# Patient Record
Sex: Male | Born: 1980
Health system: Southern US, Community
[De-identification: ages and names within clinical notes are randomized; demographics above are authoritative.]

## PROBLEM LIST (undated history)

## (undated) DIAGNOSIS — K219 Gastro-esophageal reflux disease without esophagitis: Secondary | ICD-10-CM

## (undated) DIAGNOSIS — L709 Acne, unspecified: Secondary | ICD-10-CM

## (undated) DIAGNOSIS — J309 Allergic rhinitis, unspecified: Secondary | ICD-10-CM

## (undated) DIAGNOSIS — K589 Irritable bowel syndrome without diarrhea: Secondary | ICD-10-CM

## (undated) DIAGNOSIS — K8020 Calculus of gallbladder without cholecystitis without obstruction: Secondary | ICD-10-CM

## (undated) HISTORY — DX: Gastro-esophageal reflux disease without esophagitis: K21.9

## (undated) HISTORY — DX: Irritable bowel syndrome without diarrhea: K58.9

## (undated) HISTORY — DX: Allergic rhinitis, unspecified: J30.9

## (undated) HISTORY — DX: Calculus of gallbladder without cholecystitis without obstruction: K80.20

## (undated) HISTORY — DX: Acne, unspecified: L70.9

---
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
## 2000-01-31 ENCOUNTER — Emergency Department (HOSPITAL_COMMUNITY): Admission: EM | Admit: 2000-01-31 | Discharge: 2000-02-01 | Payer: Self-pay | Admitting: Emergency Medicine

## 2000-01-31 ENCOUNTER — Emergency Department (HOSPITAL_COMMUNITY): Admission: EM | Admit: 2000-01-31 | Discharge: 2000-01-31 | Payer: Self-pay | Admitting: Emergency Medicine

## 2003-02-23 ENCOUNTER — Emergency Department (HOSPITAL_COMMUNITY): Admission: EM | Admit: 2003-02-23 | Discharge: 2003-02-23 | Payer: Self-pay

## 2007-08-13 ENCOUNTER — Encounter: Admission: RE | Admit: 2007-08-13 | Discharge: 2007-08-13 | Payer: Self-pay | Admitting: Occupational Medicine

## 2007-11-03 ENCOUNTER — Emergency Department (HOSPITAL_COMMUNITY): Admission: EM | Admit: 2007-11-03 | Discharge: 2007-11-03 | Payer: Self-pay | Admitting: Emergency Medicine

## 2007-11-06 ENCOUNTER — Emergency Department (HOSPITAL_COMMUNITY): Admission: EM | Admit: 2007-11-06 | Discharge: 2007-11-06 | Payer: Self-pay | Admitting: Emergency Medicine

## 2009-05-28 ENCOUNTER — Emergency Department (HOSPITAL_COMMUNITY): Admission: EM | Admit: 2009-05-28 | Discharge: 2009-05-28 | Payer: Self-pay | Admitting: Emergency Medicine

## 2011-11-30 ENCOUNTER — Emergency Department (HOSPITAL_COMMUNITY)
Admission: EM | Admit: 2011-11-30 | Discharge: 2011-11-30 | Disposition: A | Discharge status: Home / Self Care | Payer: 59 | Attending: Emergency Medicine | Admitting: Emergency Medicine

## 2011-11-30 ENCOUNTER — Other Ambulatory Visit: Payer: Self-pay

## 2011-11-30 ENCOUNTER — Encounter (HOSPITAL_COMMUNITY): Payer: Self-pay | Admitting: *Deleted

## 2011-11-30 DIAGNOSIS — R197 Diarrhea, unspecified: Secondary | ICD-10-CM

## 2011-11-30 DIAGNOSIS — R55 Syncope and collapse: Secondary | ICD-10-CM | POA: Insufficient documentation

## 2011-11-30 LAB — POCT I-STAT, CHEM 8
BUN: 21 mg/dL (ref 6–23)
Calcium, Ion: 1.23 mmol/L (ref 1.12–1.32)
Chloride: 106 mEq/L (ref 96–112)
Creatinine, Ser: 1.3 mg/dL (ref 0.50–1.35)
Glucose, Bld: 119 mg/dL — ABNORMAL HIGH (ref 70–99)
HCT: 51 % (ref 39.0–52.0)
Hemoglobin: 17.3 g/dL — ABNORMAL HIGH (ref 13.0–17.0)
Potassium: 3.9 mEq/L (ref 3.5–5.1)
Sodium: 143 mEq/L (ref 135–145)
TCO2: 27 mmol/L (ref 0–100)

## 2011-11-30 LAB — CBC
HCT: 46.7 % (ref 39.0–52.0)
Hemoglobin: 16.2 g/dL (ref 13.0–17.0)
MCH: 30.1 pg (ref 26.0–34.0)
MCHC: 34.7 g/dL (ref 30.0–36.0)
MCV: 86.8 fL (ref 78.0–100.0)
Platelets: 169 10*3/uL (ref 150–400)
RBC: 5.38 MIL/uL (ref 4.22–5.81)
RDW: 13.6 % (ref 11.5–15.5)
WBC: 7.4 10*3/uL (ref 4.0–10.5)

## 2011-11-30 LAB — DIFFERENTIAL
Basophils Relative: 0 % (ref 0–1)
Lymphs Abs: 1.1 10*3/uL (ref 0.7–4.0)
Monocytes Relative: 6 % (ref 3–12)
Neutro Abs: 5.7 10*3/uL (ref 1.7–7.7)
Neutrophils Relative %: 78 % — ABNORMAL HIGH (ref 43–77)

## 2011-11-30 MED ORDER — LOPERAMIDE HCL 2 MG PO CAPS
4.0000 mg | ORAL_CAPSULE | ORAL | Status: DC | PRN
  Administered 2011-11-30: 4 mg via ORAL
  Filled 2011-11-30: qty 2

## 2011-11-30 MED ORDER — SODIUM CHLORIDE 0.9 % IV BOLUS (SEPSIS)
1000.0000 mL | Freq: Once | INTRAVENOUS | Status: AC
  Administered 2011-11-30: 1000 mL via INTRAVENOUS

## 2011-11-30 NOTE — ED Notes (Signed)
Received report from Lindsey, RN.

## 2011-11-30 NOTE — ED Provider Notes (Signed)
Medical screening examination/treatment/procedure(s) were performed by non-physician practitioner and as supervising physician I was immediately available for consultation/collaboration. Rachelanne Whidby Y.   Gavin Pound. Oletta Lamas, MD 11/30/11 [phone]

## 2011-11-30 NOTE — ED Notes (Signed)
The pt has had diarrhea all day today.  abd cramps also

## 2011-11-30 NOTE — ED Notes (Signed)
Midlevel at bedside reevaluating patient

## 2011-11-30 NOTE — ED Notes (Signed)
Pt reports diarrhea x twenty times today. Reports also passing out one hour ago. States unsure if hit head. No noted abrasions to head. Reports "i must be dehydrated". Pulse noted palpable in seventies. Skin turgor good. Reports he is able to hold liquids down. Resp are unlabored. Skin warm and dry. Family at bedside. No acute distress is noted.

## 2011-11-30 NOTE — ED Provider Notes (Signed)
History     CSN: [id number]  Arrival date & time 11/30/11  0203   First MD Initiated Contact with Patient 11/30/11 0234      Chief Complaint  Patient presents with  . Diarrhea    (Consider location/radiation/quality/duration/timing/severity/associated sxs/prior treatment) HPI Comments: 2 Ludeman has had watery diarrhea since 9 AM this morning.  Multiple, episodes he's been trying to sip on fluids.  No vomiting, no nausea.  This evening he had a syncopal episode in the bathroom while attempting to get there in time for bowel movement.  He reports his 31-year-old who attends day care had the same thing one day ago  Patient is a 31 y.o. male presenting with diarrhea. The history is provided by the patient.  Diarrhea The primary symptoms include diarrhea. Primary symptoms do not include fever, abdominal pain, nausea or vomiting. The illness began today. The onset was gradual. The problem has not changed since onset. The illness does not include chills or constipation.    History reviewed. No pertinent past medical history.  History reviewed. No pertinent past surgical history.  History reviewed. No pertinent family history.  History  Substance Use Topics  . Smoking status: Never Smoker   . Smokeless tobacco: Not on file  . Alcohol Use: Yes      Review of Systems  Constitutional: Negative for fever and chills.  Cardiovascular: Negative for chest pain.  Gastrointestinal: Positive for diarrhea. Negative for nausea, vomiting, abdominal pain, constipation and blood in stool.  Neurological: Positive for syncope. Negative for dizziness.    Allergies  Review of patient's allergies indicates no known allergies.  Home Medications   Current Outpatient Rx  Name Route Sig Dispense Refill  . FEXOFENADINE HCL 180 MG PO TABS Oral Take 180 mg by mouth daily.    Marland Kitchen OMEPRAZOLE 20 MG PO CPDR Oral Take 20 mg by mouth daily.      BP 120/50  Pulse 77  Resp 18  SpO2 100%  Physical Exam   Constitutional: He is oriented to person, place, and time. He appears well-developed and well-nourished.  HENT:  Head: Normocephalic.  Eyes: Pupils are equal, round, and reactive to light.  Neck: Normal range of motion. Neck supple.  Cardiovascular: Normal rate.   Pulmonary/Chest: Effort normal.  Abdominal: Soft. There is no tenderness. There is no rebound and no guarding.  Musculoskeletal: Normal range of motion.  Neurological: He is alert and oriented to person, place, and time.  Skin: Skin is warm and dry.    ED Course  Procedures (including critical care time)  Labs Reviewed  DIFFERENTIAL - Abnormal; Notable for the following:    Neutrophils Relative 78 (*)    All other components within normal limits  POCT I-STAT, CHEM 8 - Abnormal; Notable for the following:    Glucose, Bld 119 (*)    Hemoglobin 17.3 (*)    All other components within normal limits  CBC  I-STAT, CHEM 8   No results found.   1. Diarrhea     After 2 L of fluid.  Patient is no longer having abdominal cramping.  Does not feel like he is to have a bowel movement.  No longer dizzy when moving in the bed. Tolerated  by mouth challenge  MDM  Diarrhea with dehydration        Arman Filter, NP 11/30/11 0239  Arman Filter, NP 11/30/11 0500

## 2015-06-09 ENCOUNTER — Emergency Department (HOSPITAL_COMMUNITY)
Admission: EM | Admit: 2015-06-09 | Discharge: 2015-06-09 | Disposition: A | Discharge status: Home / Self Care | Payer: Worker's Compensation | Attending: Emergency Medicine | Admitting: Emergency Medicine

## 2015-06-09 ENCOUNTER — Emergency Department (HOSPITAL_COMMUNITY): Payer: Worker's Compensation

## 2015-06-09 ENCOUNTER — Encounter (HOSPITAL_COMMUNITY): Payer: Self-pay | Admitting: General Practice

## 2015-06-09 DIAGNOSIS — Y939 Activity, unspecified: Secondary | ICD-10-CM | POA: Diagnosis not present

## 2015-06-09 DIAGNOSIS — Z79899 Other long term (current) drug therapy: Secondary | ICD-10-CM | POA: Diagnosis not present

## 2015-06-09 DIAGNOSIS — Y929 Unspecified place or not applicable: Secondary | ICD-10-CM | POA: Insufficient documentation

## 2015-06-09 DIAGNOSIS — S5011XA Contusion of right forearm, initial encounter: Secondary | ICD-10-CM | POA: Insufficient documentation

## 2015-06-09 DIAGNOSIS — S62308A Unspecified fracture of other metacarpal bone, initial encounter for closed fracture: Secondary | ICD-10-CM

## 2015-06-09 DIAGNOSIS — S6991XA Unspecified injury of right wrist, hand and finger(s), initial encounter: Secondary | ICD-10-CM | POA: Diagnosis present

## 2015-06-09 DIAGNOSIS — Y990 Civilian activity done for income or pay: Secondary | ICD-10-CM | POA: Insufficient documentation

## 2015-06-09 DIAGNOSIS — W231XXA Caught, crushed, jammed, or pinched between stationary objects, initial encounter: Secondary | ICD-10-CM | POA: Diagnosis not present

## 2015-06-09 DIAGNOSIS — S62346A Nondisplaced fracture of base of fifth metacarpal bone, right hand, initial encounter for closed fracture: Secondary | ICD-10-CM | POA: Insufficient documentation

## 2015-06-09 MED ORDER — IBUPROFEN 600 MG PO TABS: 600.0000 mg | ORAL_TABLET | Freq: Four times a day (QID) | ORAL | Status: AC | PRN

## 2015-06-09 MED ORDER — HYDROCODONE-ACETAMINOPHEN 5-325 MG PO TABS: 1.0000 | ORAL_TABLET | Freq: Four times a day (QID) | ORAL | Status: DC | PRN
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
## 2015-06-09 NOTE — Discharge Instructions (Signed)
Ibuprofen for pain. Norco for severe pain. Keep your hand elevated. Ice several times a day. Follow-up with a hand specialist next week.   Boxer's Fracture You have a break (fracture) of the fifth metacarpal bone. This is commonly called a boxer's fracture. This is the bone in the hand where the little finger attaches. The fracture is in the end of that bone, closest to the little finger. It is usually caused when you hit an object with a clenched fist. Often, the knuckle is pushed down by the impact. Sometimes, the fracture rotates out of position. A boxer's fracture will usually heal within 6 weeks, if it is treated properly and protected from re-injury. Surgery is sometimes needed. A cast, splint, or bulky hand dressing may be used to protect and immobilize a boxer's fracture. Do not remove this device or dressing until your caregiver approves. Keep your hand elevated, and apply ice packs for 15-20 minutes every 2 hours, for the first 2 days. Elevation and ice help reduce swelling and relieve pain. See your caregiver, or an orthopedic specialist, for follow-up care within the next 10 days. This is to make sure your fracture is healing properly. Document Released: 10/28/2005 Document Revised: 01/20/2012 Document Reviewed: 04/17/2007 [facility] Patient Information 2015 [place], [place]. This information is not intended to replace advice given to you by your health care provider. Make sure you discuss any questions you have with your health care provider.
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
## 2015-06-09 NOTE — Progress Notes (Signed)
Orthopedic Tech Progress Note Patient Details:  Ryan Garrett August 19, 1981 [id number] Applied fiberglass ulnar gutter splint to RUE.  Pulses, sensation, motion intact before and after splinting.  Capillary refill less than 2 seconds before and after splinting. Ortho Devices Type of Ortho Device: Ulna gutter splint Ortho Device/Splint Location: RUE Ortho Device/Splint Interventions: Application   Darrol Poke 06/09/2015, 2:02 PM

## 2016-10-02 ENCOUNTER — Other Ambulatory Visit: Payer: Self-pay | Admitting: Internal Medicine

## 2016-10-02 DIAGNOSIS — R1011 Right upper quadrant pain: Secondary | ICD-10-CM

## 2016-10-15 ENCOUNTER — Ambulatory Visit
Admission: RE | Admit: 2016-10-15 | Discharge: 2016-10-15 | Disposition: A | Discharge status: Home / Self Care | Payer: Commercial Managed Care - HMO | Source: Ambulatory Visit | Attending: Internal Medicine | Admitting: Internal Medicine

## 2016-10-15 DIAGNOSIS — R1011 Right upper quadrant pain: Secondary | ICD-10-CM

## 2017-02-20 DIAGNOSIS — D235 Other benign neoplasm of skin of trunk: Secondary | ICD-10-CM | POA: Diagnosis not present

## 2017-02-20 DIAGNOSIS — L814 Other melanin hyperpigmentation: Secondary | ICD-10-CM | POA: Diagnosis not present

## 2017-03-11 HISTORY — PX: CHOLECYSTECTOMY: SHX55

## 2017-04-09 DIAGNOSIS — K219 Gastro-esophageal reflux disease without esophagitis: Secondary | ICD-10-CM | POA: Diagnosis not present

## 2017-04-09 DIAGNOSIS — R945 Abnormal results of liver function studies: Secondary | ICD-10-CM | POA: Diagnosis not present

## 2017-04-09 DIAGNOSIS — K8020 Calculus of gallbladder without cholecystitis without obstruction: Secondary | ICD-10-CM | POA: Diagnosis not present

## 2017-04-09 DIAGNOSIS — R1011 Right upper quadrant pain: Secondary | ICD-10-CM | POA: Diagnosis not present

## 2017-04-10 ENCOUNTER — Ambulatory Visit: Payer: Self-pay | Admitting: General Surgery

## 2017-04-10 DIAGNOSIS — K8020 Calculus of gallbladder without cholecystitis without obstruction: Secondary | ICD-10-CM | POA: Diagnosis not present

## 2017-04-10 NOTE — H&P (Signed)
History of Present Illness Ralene Ok MD; 04/10/2017 12:04 PM) The patient is a 36 year old male who presents for evaluation of gall stones. The patient is a 36 year old male who is referred by Dr. Joylene Draft for evaluation of symptomatically states. Patient had a six-month history of her quadrant abdominal pain. He states this is usually after eating high fat meal. He states the pain last for approximately 4 hours. His most recent attack was this past weekend. Patient underwent a CT scan which revealed gallstones within the gallbladder and in the possibly of gallbladder neck. There was some diffuse wall thickening. Patient does state this is associated with nausea hyperemesis. Patient denies any fever, chills. Patient denies all other review of systems.   Past Surgical History Malachy Moan, [place]; 04/10/2017 11:46 AM) No pertinent past surgical history   Diagnostic Studies History Malachy Moan, [place]; 04/10/2017 11:46 AM) Colonoscopy  never  Allergies Malachy Moan, RMA; 04/10/2017 11:47 AM) No Known Allergies 04/10/2017  Medication History Malachy Moan, RMA; 04/10/2017 11:48 AM) Delma Freeze (180MG  Tablet, Oral) Active. Omeprazole (20MG  Capsule DR, Oral) Active. Ibuprofen (600MG  Tablet, Oral) Active. Multivitamins (Oral) Active. Flonase (50MCG/ACT Suspension, Nasal) Active. Medications Reconciled  Social History Malachy Moan, [place]; 04/10/2017 11:46 AM) Alcohol use  Occasional alcohol use. Caffeine use  Coffee. No drug use  Tobacco use  Never smoker.  Family History Malachy Moan, [place]; 04/10/2017 11:46 AM) Cancer  Brother. Diabetes Mellitus  Mother. Thyroid problems  Father.  Other Problems Malachy Moan, RMA; 04/10/2017 11:46 AM) Cholelithiasis  Gastroesophageal Reflux Disease     Review of Systems Malachy Moan RMA; 04/10/2017 11:46 AM) General Not Present- Appetite Loss, Chills, Fatigue, Fever, Night Sweats, Weight Gain and  Weight Loss. Skin Not Present- Change in Wart/Mole, Dryness, Hives, Jaundice, New Lesions, Non-Healing Wounds, Rash and Ulcer. HEENT Not Present- Earache, Hearing Loss, Hoarseness, Nose Bleed, Oral Ulcers, Ringing in the Ears, Seasonal Allergies, Sinus Pain, Sore Throat, Visual Disturbances, Wears glasses/contact lenses and Yellow Eyes. Respiratory Not Present- Bloody sputum, Chronic Cough, Difficulty Breathing, Snoring and Wheezing. Breast Not Present- Breast Mass, Breast Pain, Nipple Discharge and Skin Changes. Cardiovascular Not Present- Chest Pain, Difficulty Breathing Lying Down, Leg Cramps, Palpitations, Rapid Heart Rate, Shortness of Breath and Swelling of Extremities. Gastrointestinal Not Present- Abdominal Pain, Bloating, Bloody Stool, Change in Bowel Habits, Chronic diarrhea, Constipation, Difficulty Swallowing, Excessive gas, Gets full quickly at meals, Hemorrhoids, Indigestion, Nausea, Rectal Pain and Vomiting. Male Genitourinary Not Present- Blood in Urine, Change in Urinary Stream, Frequency, Impotence, Nocturia, Painful Urination, Urgency and Urine Leakage. Musculoskeletal Not Present- Back Pain, Joint Pain, Joint Stiffness, Muscle Pain, Muscle Weakness and Swelling of Extremities. Neurological Not Present- Decreased Memory, Fainting, Headaches, Numbness, Seizures, Tingling, Tremor, Trouble walking and Weakness. Psychiatric Not Present- Anxiety, Bipolar, Change in Sleep Pattern, Depression, Fearful and Frequent crying. Endocrine Not Present- Cold Intolerance, Excessive Hunger, Hair Changes, Heat Intolerance, Hot flashes and New Diabetes. Hematology Not Present- Blood Thinners, Easy Bruising, Excessive bleeding, Gland problems, HIV and Persistent Infections.  Vitals Malachy Moan RMA; 04/10/2017 11:48 AM) 04/10/2017 11:48 AM Weight: 200.4 lb Height: 70in Body Surface Area: 2.09 m Body Mass Index: 28.75 kg/m  Temp.: 85F  Pulse: 59 (Regular)  BP: 120/80 (Sitting, Left  Arm, Standard)       Physical Exam Ralene Ok, MD; 04/10/2017 12:4 PM) General Mental Status-Alert. General Appearance-Consistent with stated age. Hydration-Well hydrated. Voice-Normal.  Head and Neck Head-normocephalic, atraumatic with no lesions or palpable masses.  Eye Eyeball - Bilateral-Extraocular movements intact. Sclera/Conjunctiva - Bilateral-No scleral  icterus.  Chest and Lung Exam Chest and lung exam reveals -quiet, even and easy respiratory effort with no use of accessory muscles. Inspection Chest Wall - Normal. Back - normal.  Cardiovascular Cardiovascular examination reveals -normal heart sounds, regular rate and rhythm with no murmurs.  Abdomen Inspection Normal Exam - No Hernias. Palpation/Percussion Normal exam - Soft, Non Tender, No Rebound tenderness, No Rigidity (guarding) and No hepatosplenomegaly. Auscultation Normal exam - Bowel sounds normal.  Neurologic Neurologic evaluation reveals -alert and oriented x 3 with no impairment of recent or remote memory. Mental Status-Normal.  Musculoskeletal Normal Exam - Left-Upper Extremity Strength Normal and Lower Extremity Strength Normal. Normal Exam - Right-Upper Extremity Strength Normal, Lower Extremity Weakness.    Assessment & Plan Ralene Ok MD; 04/10/2017 12:04 PM) SYMPTOMATIC CHOLELITHIASIS (K80.20) Impression: 36 year old male with symptomatic cholelithiasis  1. We will proceed to the operating room for a laparoscopic cholecystectomy  2. Risks and benefits were discussed with the patient to generally include, but not limited to: infection, bleeding, possible need for post op ERCP, damage to the bile ducts, bile leak, and possible need for further surgery. Alternatives were offered and described. All questions were answered and the patient voiced understanding of the procedure and wishes to proceed at this point with a laparoscopic cholecystectomy

## 2017-05-13 DIAGNOSIS — K8010 Calculus of gallbladder with chronic cholecystitis without obstruction: Secondary | ICD-10-CM | POA: Diagnosis not present

## 2017-05-13 DIAGNOSIS — K824 Cholesterolosis of gallbladder: Secondary | ICD-10-CM | POA: Diagnosis not present

## 2017-08-09 DIAGNOSIS — Z23 Encounter for immunization: Secondary | ICD-10-CM | POA: Diagnosis not present

## 2017-08-26 IMAGING — US US ABDOMEN LIMITED
1 series · 14 of 25 positions shown · non-contrast
Comparison: None.

CLINICAL DATA: Right upper quadrant abdominal pain

EXAM:
US ABDOMEN LIMITED - RIGHT UPPER QUADRANT

[Series 1: us abdomen limited · 0.30mm/px · 14 of 42 slices shown]
[im 1/42]
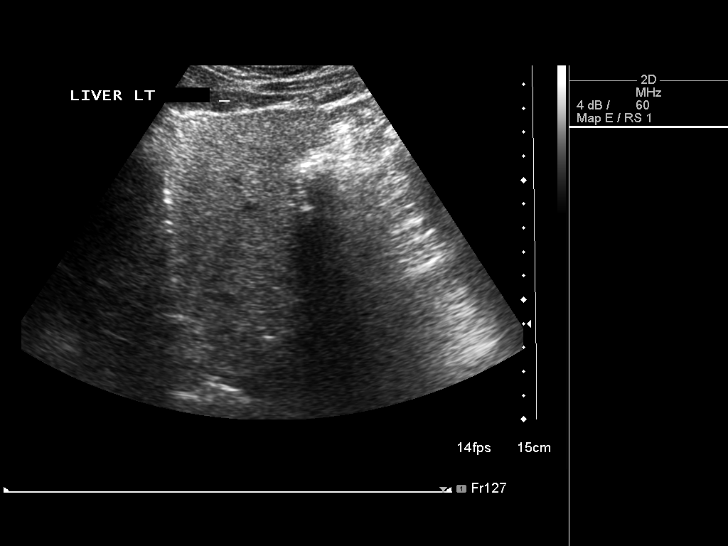
[im 4/42]
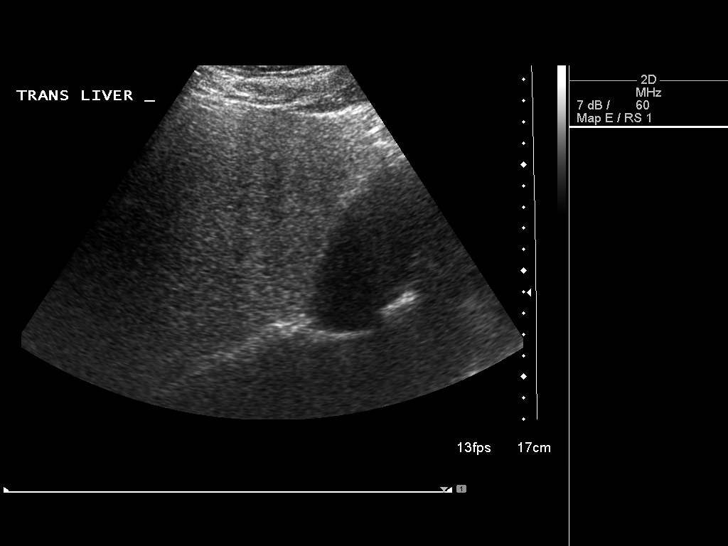
[im 7/42]
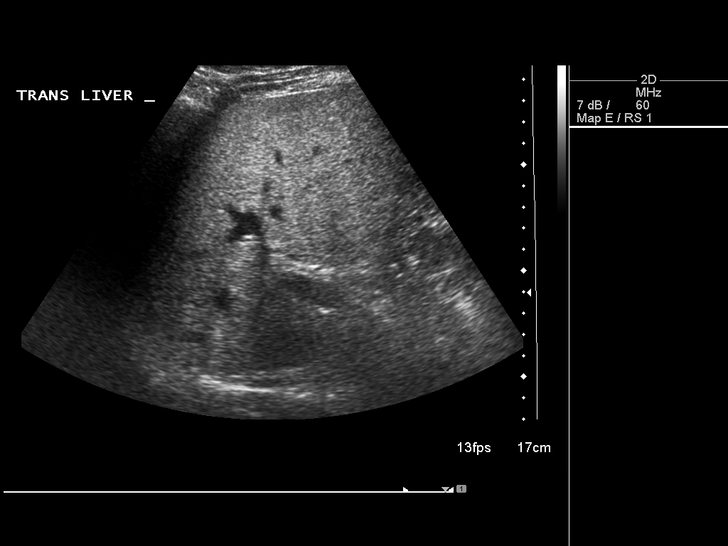
[im 11/42]
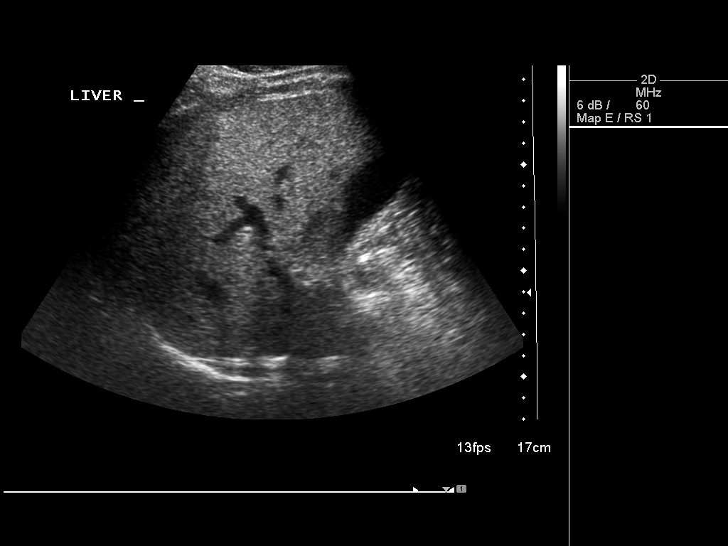
[im 14/42]
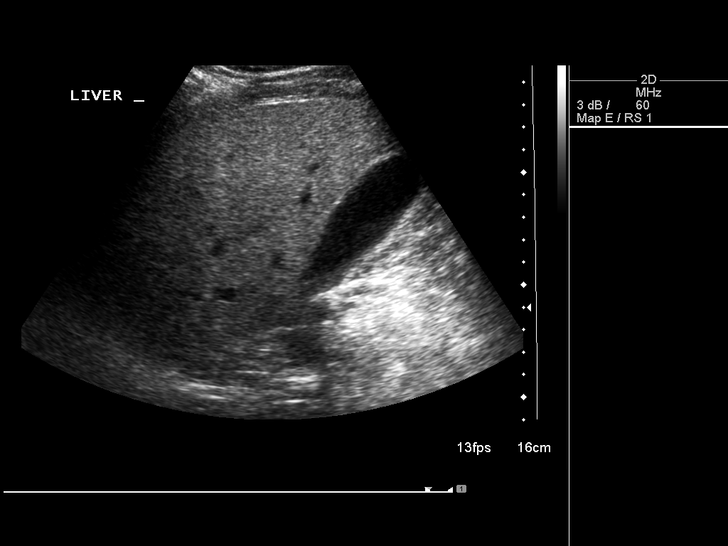
[im 16/42]
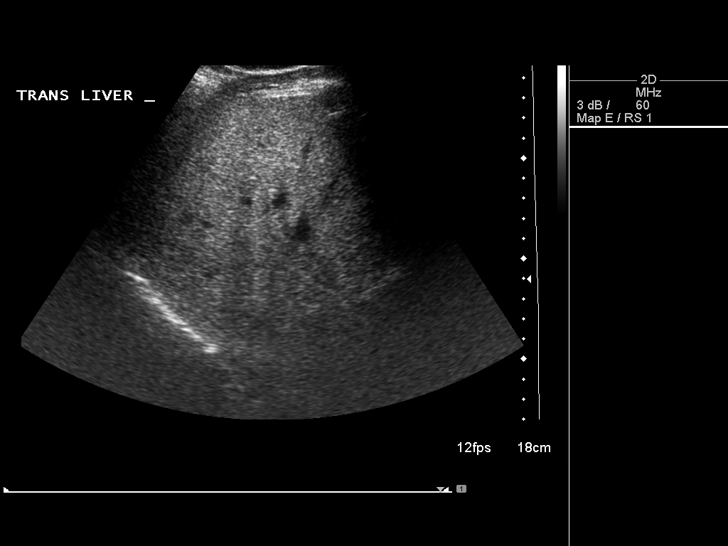
[im 19/42]
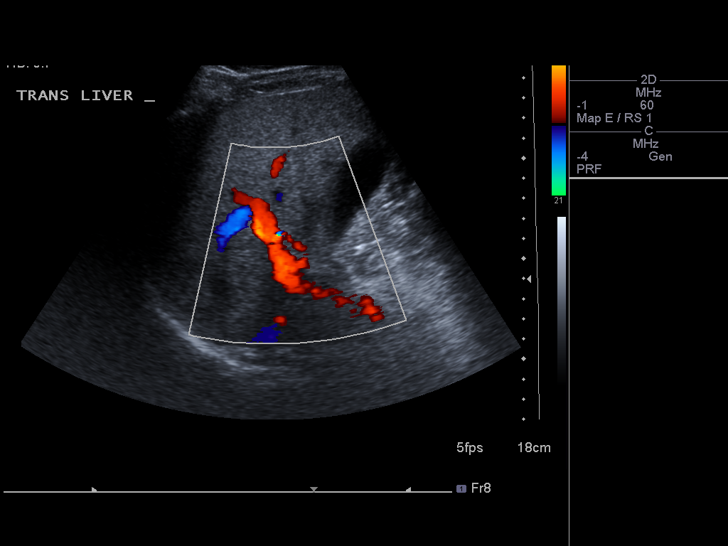
[im 23/42]
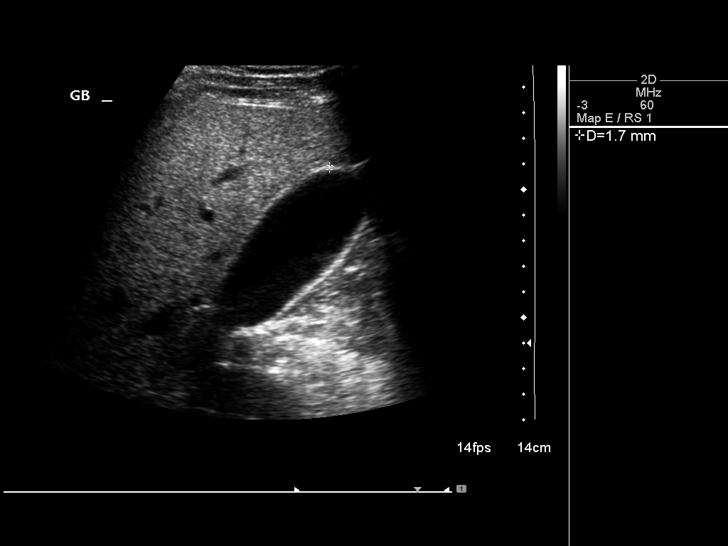
[im 26/42]
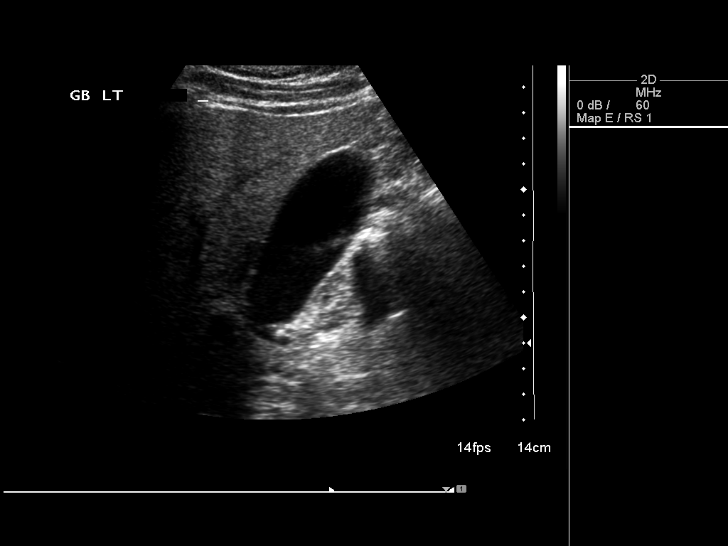
[im 28/42]
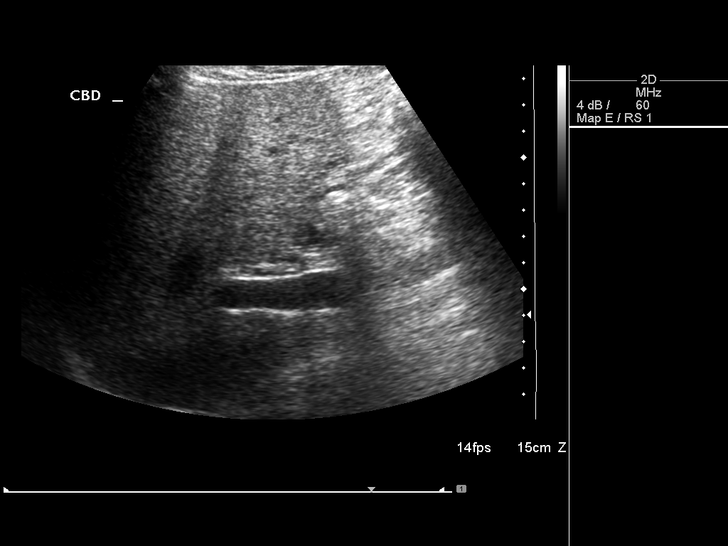
[im 31/42]
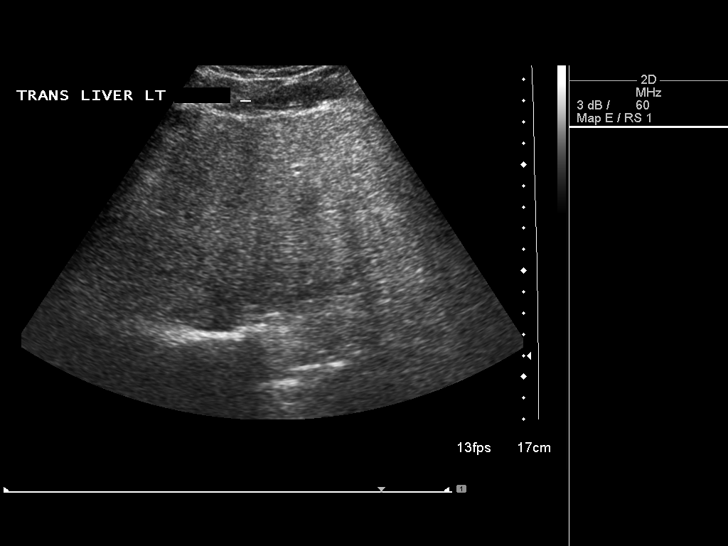
[im 35/42]
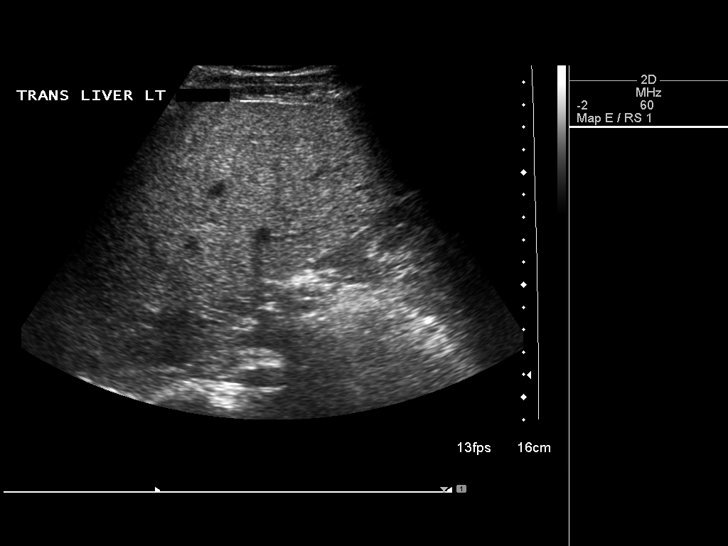
[im 38/42]
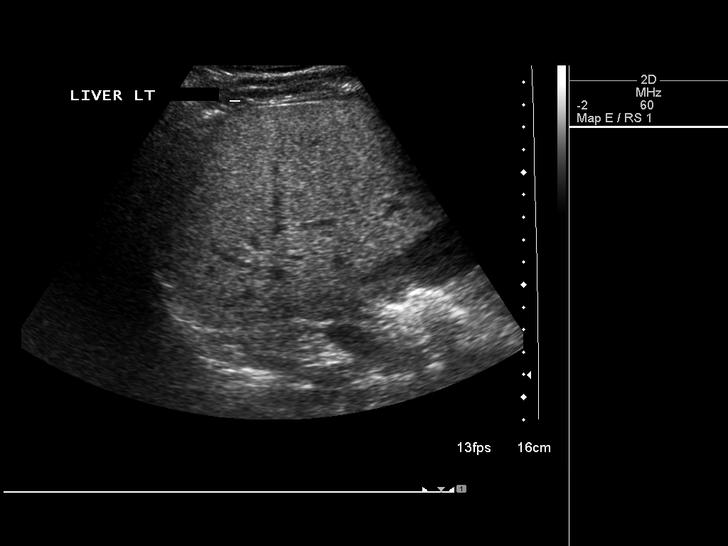
[im 42/42]
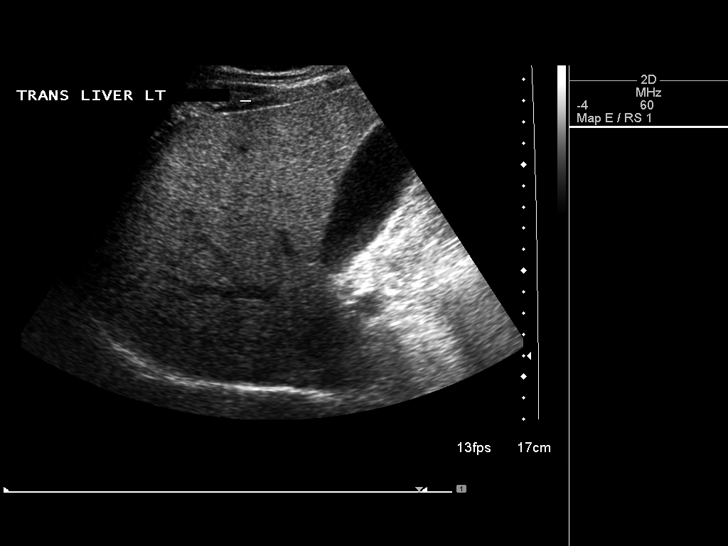

[14 of 25 positions shown; findings below may reference images not displayed]

FINDINGS: Gallbladder:

The gallbladder is visualized and no gallstones are noted. There is
no pain over the gallbladder with compression.

Common bile duct:

Diameter: The common bile duct is normal measuring 3.0 mm.

Liver:

The liver slightly echogenic which may indicate mild fatty
infiltration with possible sparing near the gallbladder. No focal
hepatic abnormality is seen.
IMPRESSION: 1. No gallstones.
2. Question mild fatty infiltration of the liver. No focal hepatic
abnormality.

## 2017-11-26 DIAGNOSIS — H5213 Myopia, bilateral: Secondary | ICD-10-CM | POA: Diagnosis not present

## 2018-01-12 DIAGNOSIS — R82998 Other abnormal findings in urine: Secondary | ICD-10-CM | POA: Diagnosis not present

## 2018-01-12 DIAGNOSIS — Z0000 Encounter for general adult medical examination without abnormal findings: Secondary | ICD-10-CM | POA: Diagnosis not present

## 2018-01-16 DIAGNOSIS — K8020 Calculus of gallbladder without cholecystitis without obstruction: Secondary | ICD-10-CM | POA: Diagnosis not present

## 2018-01-16 DIAGNOSIS — M79675 Pain in left toe(s): Secondary | ICD-10-CM | POA: Diagnosis not present

## 2018-01-16 DIAGNOSIS — H6123 Impacted cerumen, bilateral: Secondary | ICD-10-CM | POA: Diagnosis not present

## 2018-01-16 DIAGNOSIS — Z0000 Encounter for general adult medical examination without abnormal findings: Secondary | ICD-10-CM | POA: Diagnosis not present

## 2018-01-16 DIAGNOSIS — Z1389 Encounter for screening for other disorder: Secondary | ICD-10-CM | POA: Diagnosis not present

## 2018-01-16 DIAGNOSIS — M79674 Pain in right toe(s): Secondary | ICD-10-CM | POA: Diagnosis not present

## 2018-01-19 ENCOUNTER — Encounter: Payer: Self-pay | Admitting: Gastroenterology

## 2018-03-03 ENCOUNTER — Encounter: Payer: Self-pay | Admitting: *Deleted

## 2018-03-06 ENCOUNTER — Encounter: Payer: Self-pay | Admitting: Gastroenterology

## 2018-03-06 ENCOUNTER — Ambulatory Visit: Payer: 59 | Admitting: Gastroenterology

## 2018-03-06 VITALS — BP 114/68 | HR 74 | Ht 70.0 in | Wt 198.2 lb

## 2018-03-06 DIAGNOSIS — R12 Heartburn: Secondary | ICD-10-CM | POA: Diagnosis not present

## 2018-03-06 DIAGNOSIS — K219 Gastro-esophageal reflux disease without esophagitis: Secondary | ICD-10-CM

## 2018-03-06 MED ORDER — RANITIDINE HCL 300 MG PO TABS: 300.0000 mg | ORAL_TABLET | Freq: Every day | ORAL | 3 refills | Status: AC

## 2018-03-06 NOTE — Progress Notes (Signed)
Ryan Garrett    [id number]    1981-06-03  Primary Care Physician:Perini, Elta Guadeloupe, MD  Referring Physician: Crist Infante, [place], [place] [id number]  Chief complaint: Heartburn  HPI: 37 year old male here for new patient visit for evaluation of chronic GERD symptoms.  He has had heartburn for the past 10 to 15 years and has been taking omeprazole 20 mg daily.  His symptoms are well controlled when he takes the omeprazole but he does notice worsening heartburn if he skips a dose.  Denies any dysphagia, odynophagia, nausea, vomiting melena or blood per rectum.  No loss of appetite or weight loss.  Non-smoker and denies excessive use of alcohol. Diarrhea after gallbladder surgery last year improving, he has intermittent episodes when he eats high fat diet or greasy food, is trying to avoid them.  He has 2 bowel movements on most days.    Outpatient Encounter Medications as of 03/06/2018  Medication Sig  . fexofenadine (ALLEGRA) 180 MG tablet Take 180 mg by mouth daily.  Marland Kitchen ibuprofen (ADVIL,MOTRIN) 600 MG tablet Take 1 tablet (600 mg total) by mouth every 6 (six) hours as needed.  Marland Kitchen omeprazole (PRILOSEC) 20 MG capsule Take 20 mg by mouth daily.  . [DISCONTINUED] HYDROcodone-acetaminophen (NORCO) 5-325 MG per tablet Take 1 tablet by mouth every 6 (six) hours as needed for moderate pain.  . ranitidine (ZANTAC) 300 MG tablet Take 1 tablet (300 mg total) by mouth at bedtime.   No facility-administered encounter medications on file as of 03/06/2018.     Allergies as of 03/06/2018  . (No Known Allergies)    Past Medical History:  Diagnosis Date  . Acne   . Allergic rhinitis   . Cholelithiasis   . GERD (gastroesophageal reflux disease)   . IBS (irritable bowel syndrome)     Past Surgical History:  Procedure Laterality Date  . CHOLECYSTECTOMY  03/2017    Family History  Problem Relation Age of Onset  . Leukemia Brother   . Melanoma Maternal Grandmother    . Throat cancer Maternal Grandfather     Social History   Socioeconomic History  . Marital status: Married    Spouse name: Not on file  . Number of children: Not on file  . Years of education: Not on file  . Highest education level: Not on file  Occupational History  . Not on file  Social Needs  . Financial resource strain: Not on file  . Food insecurity:    Worry: Not on file    Inability: Not on file  . Transportation needs:    Medical: Not on file    Non-medical: Not on file  Tobacco Use  . Smoking status: Never Smoker  . Smokeless tobacco: Never Used  Substance and Sexual Activity  . Alcohol use: Yes  . Drug use: Not on file  . Sexual activity: Not on file  Lifestyle  . Physical activity:    Days per week: Not on file    Minutes per session: Not on file  . Stress: Not on file  Relationships  . Social connections:    Talks on phone: Not on file    Gets together: Not on file    Attends religious service: Not on file    Active member of club or organization: Not on file    Attends meetings of clubs or organizations: Not on file    Relationship status: Not on file  .  Intimate partner violence:    Fear of current or ex partner: Not on file    Emotionally abused: Not on file    Physically abused: Not on file    Forced sexual activity: Not on file  Other Topics Concern  . Not on file  Social History Narrative  . Not on file      Review of systems: Review of Systems  Constitutional: Negative for fever and chills.  HENT: Sinus problem Eyes: Negative for blurred vision.  Respiratory: Negative for cough, shortness of breath and wheezing.   Cardiovascular: Negative for chest pain and palpitations.  Gastrointestinal: as per HPI Genitourinary: Negative for dysuria, urgency, frequency and hematuria.  Musculoskeletal: Negative for myalgias, back pain and joint pain.  Skin: Negative for itching and rash.  Neurological: Negative for dizziness, tremors, focal  weakness, seizures and loss of consciousness.  Endo/Heme/Allergies: Positive for seasonal allergies.  Psychiatric/Behavioral: Negative for depression, suicidal ideas and hallucinations.  All other systems reviewed and are negative.   Physical Exam: Vitals:   03/06/18 0820  BP: 114/68  Pulse: 74   Body mass index is 28.45 kg/m. Gen:      No acute distress HEENT:  EOMI, sclera anicteric Neck:     No masses; no thyromegaly Lungs:    Clear to auscultation bilaterally; normal respiratory effort CV:         Regular rate and rhythm; no murmurs Abd:      + bowel sounds; soft, non-tender; no palpable masses, no distension Ext:    No edema; adequate peripheral perfusion Skin:      Warm and dry; no rash Neuro: alert and oriented x 3 Psych: normal mood and affect  Data Reviewed:  Reviewed labs, radiology imaging, old records and pertinent past GI work up   Assessment and Plan/Recommendations:  37 year old male with chronic heartburn likely secondary to GERD Heartburn and GERD symptoms well controlled with PPI daily Discussed the potential risks associated with long-term PPI use, especially decreased absorption of calcium and vitamin D resulting in bone loss We will do a trial of ranitidine 300 mg at bedtime as he predominantly has nocturnal symptoms and try to wean off PPI If unable to come off PPI or has any worsening symptoms we will consider EGD for further evaluation.  Patient has no other risk factors to increase the risk for Barrett's esophagus   Return in 3 months or sooner if needed  K. Denzil Magnuson , MD [phone]    CC: Crist Infante, MD

## 2018-03-06 NOTE — Patient Instructions (Signed)
We will send Zantac 300 mg daily at bedtime   Use Prilosec at bedtime     Gastroesophageal Reflux Disease, Adult Normally, food travels down the esophagus and stays in the stomach to be digested. However, when a person has gastroesophageal reflux disease (GERD), food and stomach acid move back up into the esophagus. When this happens, the esophagus becomes sore and inflamed. Over time, GERD can create small holes (ulcers) in the lining of the esophagus. What are the causes? This condition is caused by a problem with the muscle between the esophagus and the stomach (lower esophageal sphincter, or LES). Normally, the LES muscle closes after food passes through the esophagus to the stomach. When the LES is weakened or abnormal, it does not close properly, and that allows food and stomach acid to go back up into the esophagus. The LES can be weakened by certain dietary substances, medicines, and medical conditions, including:  Tobacco use.  Pregnancy.  Having a hiatal hernia.  Heavy alcohol use.  Certain foods and beverages, such as coffee, chocolate, onions, and peppermint.  What increases the risk? This condition is more likely to develop in:  People who have an increased body weight.  People who have connective tissue disorders.  People who use NSAID medicines.  What are the signs or symptoms? Symptoms of this condition include:  Heartburn.  Difficult or painful swallowing.  The feeling of having a lump in the throat.  Abitter taste in the mouth.  Bad breath.  Having a large amount of saliva.  Having an upset or bloated stomach.  Belching.  Chest pain.  Shortness of breath or wheezing.  Ongoing (chronic) cough or a night-time cough.  Wearing away of tooth enamel.  Weight loss.  Different conditions can cause chest pain. Make sure to see your health care provider if you experience chest pain. How is this diagnosed? Your health care provider will take a  medical history and perform a physical exam. To determine if you have mild or severe GERD, your health care provider may also monitor how you respond to treatment. You may also have other tests, including:  An endoscopy toexamine your stomach and esophagus with a small camera.  A test thatmeasures the acidity level in your esophagus.  A test thatmeasures how much pressure is on your esophagus.  A barium swallow or modified barium swallow to show the shape, size, and functioning of your esophagus.  How is this treated? The goal of treatment is to help relieve your symptoms and to prevent complications. Treatment for this condition may vary depending on how severe your symptoms are. Your health care provider may recommend:  Changes to your diet.  Medicine.  Surgery.  Follow these instructions at home: Diet  Follow a diet as recommended by your health care provider. This may involve avoiding foods and drinks such as: ? Coffee and tea (with or without caffeine). ? Drinks that containalcohol. ? Energy drinks and sports drinks. ? Carbonated drinks or sodas. ? Chocolate and cocoa. ? Peppermint and mint flavorings. ? Garlic and onions. ? Horseradish. ? Spicy and acidic foods, including peppers, chili powder, curry powder, vinegar, hot sauces, and barbecue sauce. ? Citrus fruit juices and citrus fruits, such as oranges, lemons, and limes. ? Tomato-based foods, such as red sauce, chili, salsa, and pizza with red sauce. ? Fried and fatty foods, such as donuts, french fries, potato chips, and high-fat dressings. ? High-fat meats, such as hot dogs and fatty cuts of red and  white meats, such as rib eye steak, sausage, ham, and bacon. ? High-fat dairy items, such as whole milk, butter, and cream cheese.  Eat small, frequent meals instead of large meals.  Avoid drinking large amounts of liquid with your meals.  Avoid eating meals during the 2-3 hours before bedtime.  Avoid lying down  right after you eat.  Do not exercise right after you eat. General instructions  Pay attention to any changes in your symptoms.  Take over-the-counter and prescription medicines only as told by your health care provider. Do not take aspirin, ibuprofen, or other NSAIDs unless your health care provider told you to do so.  Do not use any tobacco products, including cigarettes, chewing tobacco, and e-cigarettes. If you need help quitting, ask your health care provider.  Wear loose-fitting clothing. Do not wear anything tight around your waist that causes pressure on your abdomen.  Raise (elevate) the head of your bed 6 inches (15cm).  Try to reduce your stress, such as with yoga or meditation. If you need help reducing stress, ask your health care provider.  If you are overweight, reduce your weight to an amount that is healthy for you. Ask your health care provider for guidance about a safe weight loss goal.  Keep all follow-up visits as told by your health care provider. This is important. Contact a health care provider if:  You have new symptoms.  You have unexplained weight loss.  You have difficulty swallowing, or it hurts to swallow.  You have wheezing or a persistent cough.  Your symptoms do not improve with treatment.  You have a hoarse voice. Get help right away if:  You have pain in your arms, neck, jaw, teeth, or back.  You feel sweaty, dizzy, or light-headed.  You have chest pain or shortness of breath.  You vomit and your vomit looks like blood or coffee grounds.  You faint.  Your stool is bloody or black.  You cannot swallow, drink, or eat. This information is not intended to replace advice given to you by your health care provider. Make sure you discuss any questions you have with your health care provider. Document Released: 08/07/2005 Document Revised: 03/27/2016 Document Reviewed: 02/22/2015 [employer] Interactive Patient Education  [employer].

## 2018-03-16 DIAGNOSIS — L814 Other melanin hyperpigmentation: Secondary | ICD-10-CM | POA: Diagnosis not present

## 2018-03-16 DIAGNOSIS — L905 Scar conditions and fibrosis of skin: Secondary | ICD-10-CM | POA: Diagnosis not present

## 2018-03-16 DIAGNOSIS — D225 Melanocytic nevi of trunk: Secondary | ICD-10-CM | POA: Diagnosis not present

## 2018-06-09 ENCOUNTER — Ambulatory Visit: Payer: Self-pay | Admitting: Gastroenterology

## 2018-08-08 DIAGNOSIS — Z23 Encounter for immunization: Secondary | ICD-10-CM | POA: Diagnosis not present

## 2018-12-15 DIAGNOSIS — H5213 Myopia, bilateral: Secondary | ICD-10-CM | POA: Diagnosis not present

## 2022-05-30 ENCOUNTER — Encounter: Payer: Self-pay | Admitting: Internal Medicine
